# Patient Record
Sex: Female | Born: 1975 | Race: White | Hispanic: No | Marital: Married | Smoking: Never smoker
Health system: Southern US, Community
[De-identification: ages and names within clinical notes are randomized; demographics above are authoritative.]

## PROBLEM LIST (undated history)

## (undated) DIAGNOSIS — E079 Disorder of thyroid, unspecified: Secondary | ICD-10-CM

---

## 2001-02-05 ENCOUNTER — Other Ambulatory Visit: Admission: RE | Admit: 2001-02-05 | Discharge: 2001-02-05 | Payer: Self-pay | Admitting: Obstetrics and Gynecology

## 2001-03-19 ENCOUNTER — Encounter: Admission: RE | Admit: 2001-03-19 | Discharge: 2001-03-19 | Payer: Self-pay | Admitting: *Deleted

## 2001-03-19 ENCOUNTER — Encounter: Payer: Self-pay | Admitting: *Deleted

## 2001-07-03 ENCOUNTER — Ambulatory Visit (HOSPITAL_BASED_OUTPATIENT_CLINIC_OR_DEPARTMENT_OTHER): Admission: RE | Admit: 2001-07-03 | Discharge: 2001-07-03 | Payer: Self-pay | Admitting: *Deleted

## 2001-07-03 ENCOUNTER — Encounter (INDEPENDENT_AMBULATORY_CARE_PROVIDER_SITE_OTHER): Payer: Self-pay | Admitting: *Deleted

## 2002-03-19 ENCOUNTER — Other Ambulatory Visit: Admission: RE | Admit: 2002-03-19 | Discharge: 2002-03-19 | Payer: Self-pay | Admitting: *Deleted

## 2003-04-04 ENCOUNTER — Other Ambulatory Visit: Admission: RE | Admit: 2003-04-04 | Discharge: 2003-04-04 | Payer: Self-pay | Admitting: *Deleted

## 2003-09-18 ENCOUNTER — Inpatient Hospital Stay (HOSPITAL_COMMUNITY): Admission: AD | Admit: 2003-09-18 | Discharge: 2003-09-18 | Payer: Self-pay | Admitting: Obstetrics and Gynecology

## 2003-09-28 ENCOUNTER — Inpatient Hospital Stay (HOSPITAL_COMMUNITY): Admission: AD | Admit: 2003-09-28 | Discharge: 2003-09-28 | Payer: Self-pay | Admitting: Obstetrics and Gynecology

## 2003-10-16 ENCOUNTER — Inpatient Hospital Stay (HOSPITAL_COMMUNITY): Admission: AD | Admit: 2003-10-16 | Discharge: 2003-10-18 | Payer: Self-pay | Admitting: Obstetrics and Gynecology

## 2003-11-10 ENCOUNTER — Inpatient Hospital Stay (HOSPITAL_COMMUNITY): Admission: AD | Admit: 2003-11-10 | Discharge: 2003-11-10 | Payer: Self-pay | Admitting: Obstetrics and Gynecology

## 2003-11-24 ENCOUNTER — Other Ambulatory Visit: Admission: RE | Admit: 2003-11-24 | Discharge: 2003-11-24 | Payer: Self-pay | Admitting: Obstetrics and Gynecology

## 2005-02-17 ENCOUNTER — Other Ambulatory Visit: Admission: RE | Admit: 2005-02-17 | Discharge: 2005-02-17 | Payer: Self-pay | Admitting: Obstetrics and Gynecology

## 2005-09-01 ENCOUNTER — Inpatient Hospital Stay (HOSPITAL_COMMUNITY): Admission: AD | Admit: 2005-09-01 | Discharge: 2005-09-03 | Payer: Self-pay | Admitting: Obstetrics and Gynecology

## 2006-03-15 ENCOUNTER — Encounter (HOSPITAL_COMMUNITY): Admission: RE | Admit: 2006-03-15 | Discharge: 2006-03-16 | Payer: Self-pay | Admitting: Internal Medicine

## 2006-08-15 ENCOUNTER — Ambulatory Visit: Payer: Self-pay | Admitting: "Endocrinology

## 2006-11-16 ENCOUNTER — Ambulatory Visit: Payer: Self-pay | Admitting: "Endocrinology

## 2007-03-02 ENCOUNTER — Encounter: Admission: RE | Admit: 2007-03-02 | Discharge: 2007-03-02 | Payer: Self-pay | Admitting: Family Medicine

## 2009-02-03 IMAGING — CR DG CHEST 2V
2 series · 2 of 2 positions shown · non-contrast
Comparison: none

CLINICAL DATA: Cough and fever.  Query left lower lobar pneumonia.  
CHEST, TWO VIEWS: 
Mild likely chronic peribronchial thickening.  Suggestion of poor definition of portion of the posterior aspect of the left hemidiaphragm on the lateral view and moderate suspicion for abnormal density at the posteromedial base of the left lower lobe.  This is moderately suspicious for pneumonia.  Normal cardiac size and shape.

[w chest pa]
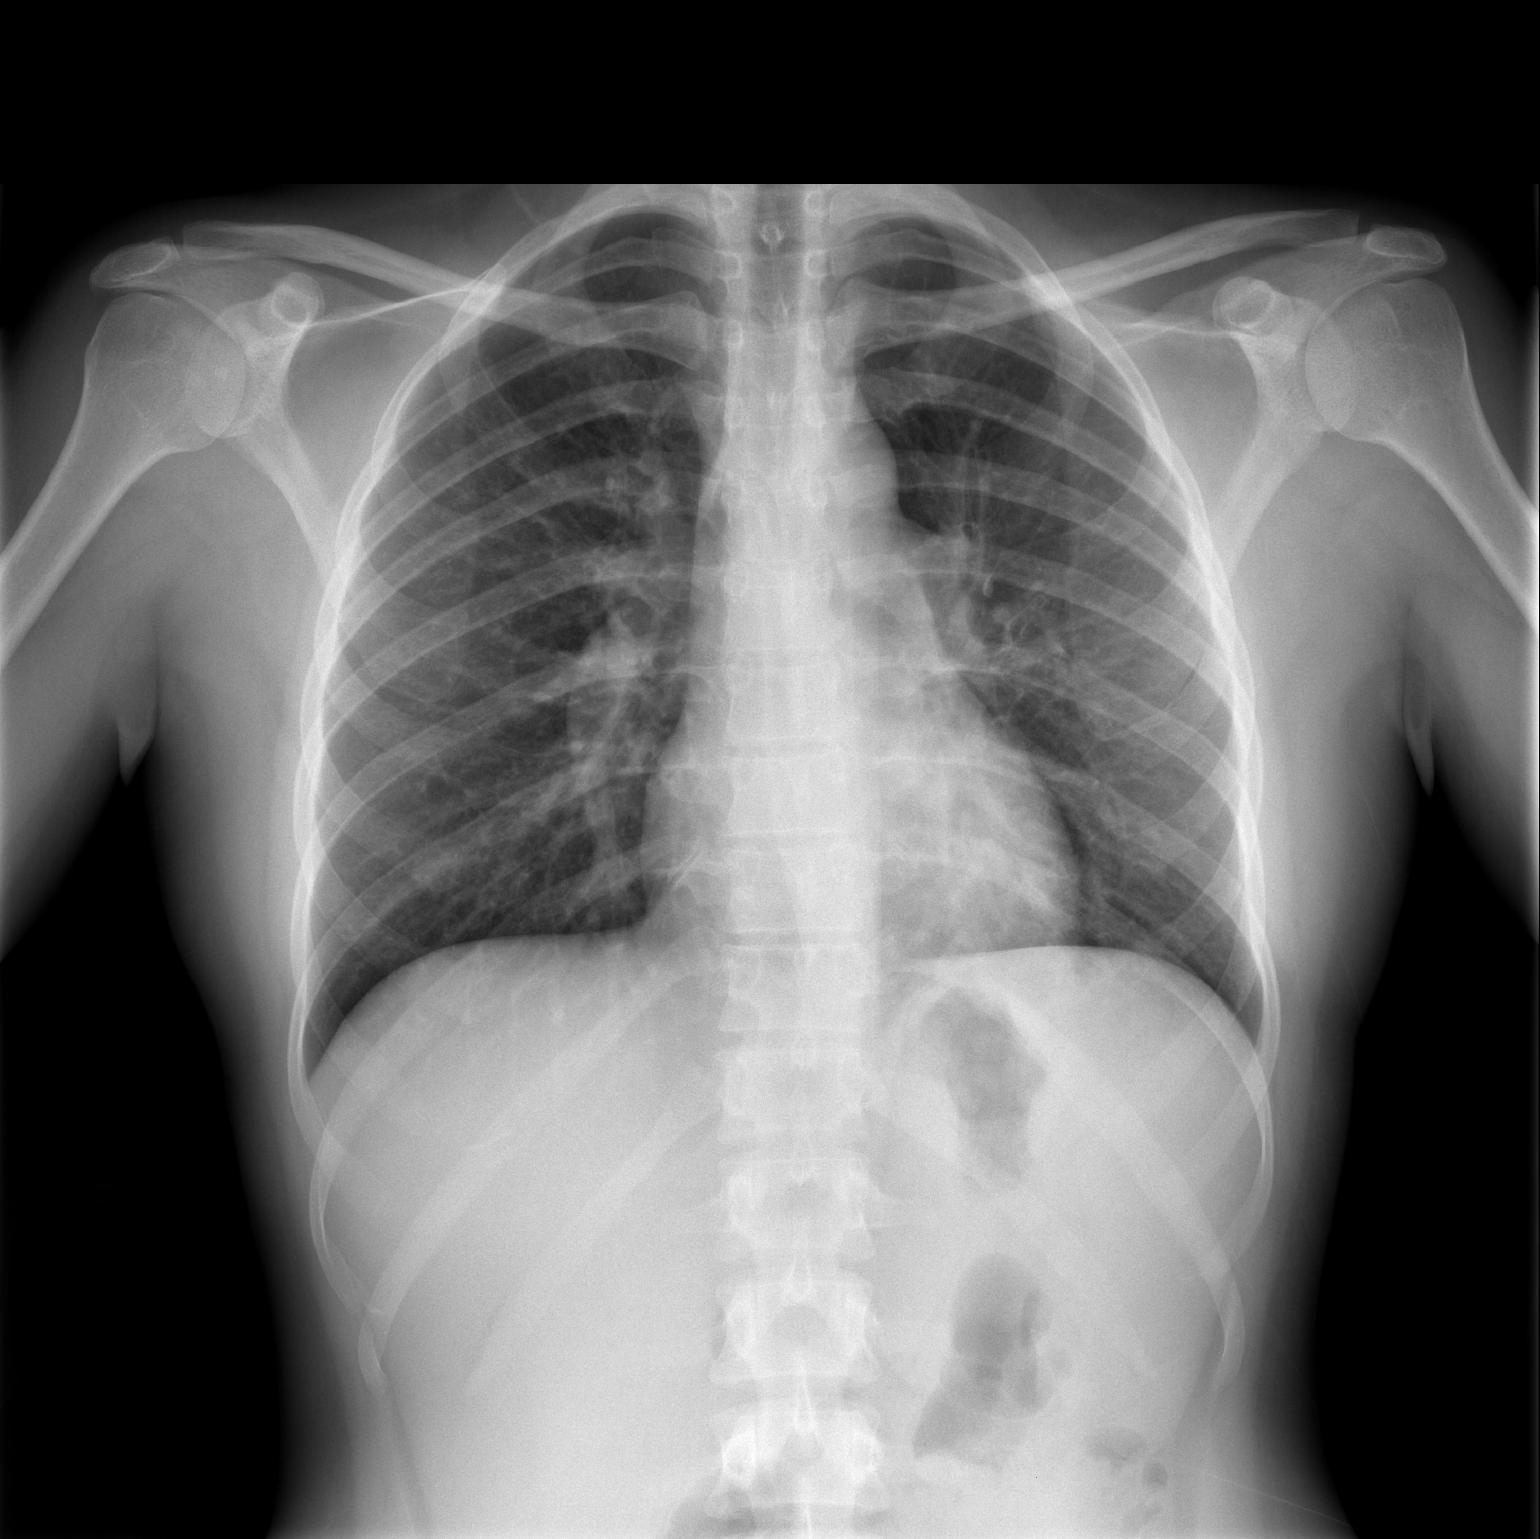

[w chest lat]
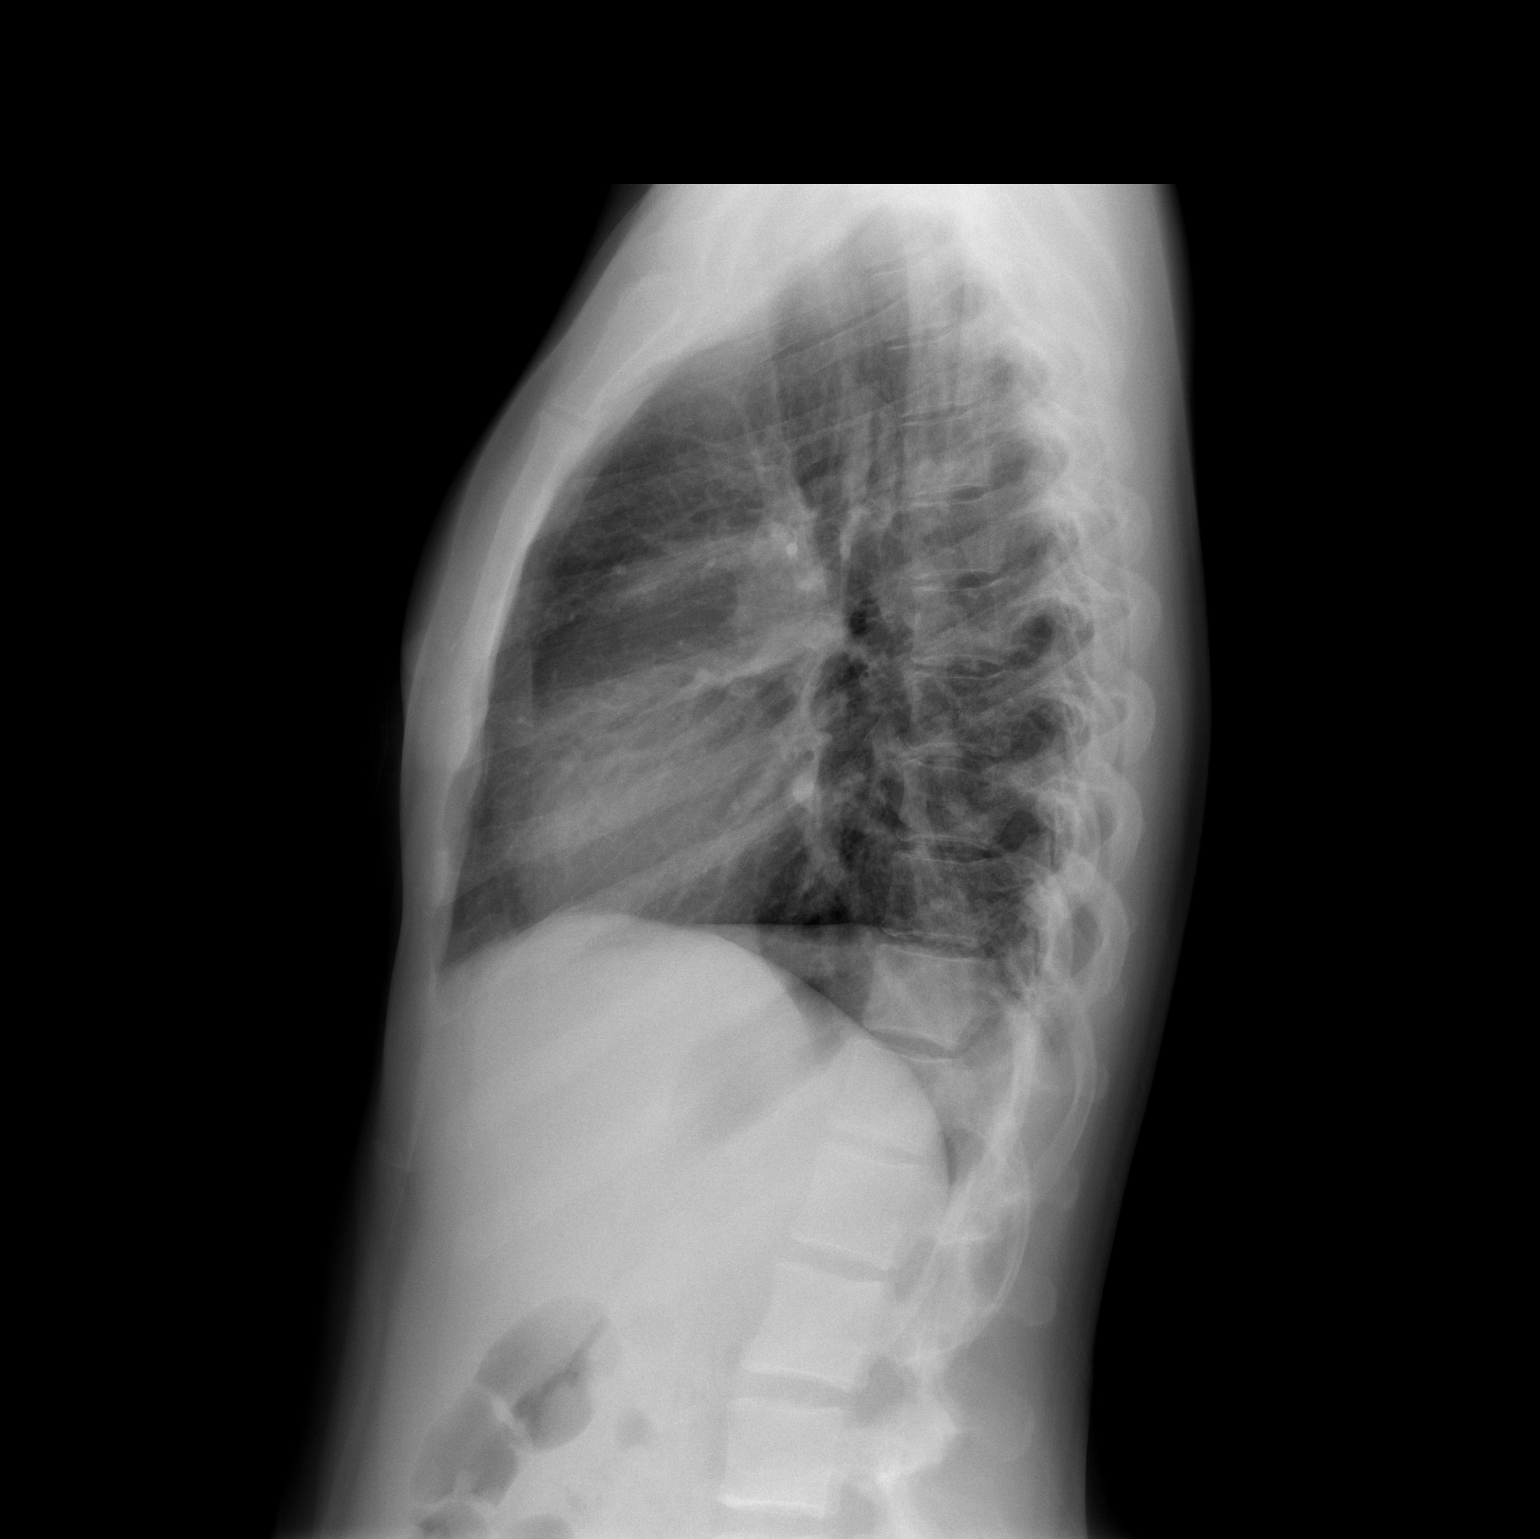

[2 of 2 positions shown; findings below may reference images not displayed]

IMPRESSION: Moderate suspicion for left lower lobar pneumonia.  Mild chronic appearing peribronchial changes.

## 2010-05-21 NOTE — Op Note (Signed)
[facility]  Patient:    Cynthia Gregory, Cynthia Gregory Visit Number: [id number] MRN: [id number]          Service Type: DSU Location: [facility] Attending Physician:  Kandis Mannan Dictated by:   Donnie Coffin Samuella Cota, M.D. Proc. Date: 07/03/01 Admit Date:  07/03/2001   CC:         Willey Blade, M.D.   Operative Report  CCS# [id number]  PREOPERATIVE DIAGNOSIS:  Mass, right breast.  POSTOPERATIVE DIAGNOSIS:  Mass, right breast.  PROCEDURE:  Excision of mass, right breast.  SURGEON:  Maisie Fus B. Samuella Cota, M.D.  ANESTHESIA:  1% Xylocaine local with anesthesia monitoring by anesthesiology and CRNA Daphine Deutscher.  DESCRIPTION OF PROCEDURE:  The patient was taken to the operating room and placed on the table in the supine position.  The right breast was prepped and draped as a sterile field.  The patient had a small mass just beneath the edge of the areola at the 9 oclock position.  A curved circumareolar incision was outlined with the skin marker.  1% Xylocaine local was used to infiltrate skin and underlying breast tissue.  The curved incision was made and dissection taken down to the mass which was slightly large than a BB.  A small 3-0 Vicryl suture was placed through the small for traction and then some of the breast tissue around the mass was excised with sharp dissection and removed.  Upon removal, the mass seemed to be in the middle of the specimen and once again felt just larger than a BB.  Bleeding was controlled with the cautery and the wound was then closed with interrupted 3-0 Vicryl sutures followed by a running 4-0 Vicryl subcuticular suture.  Benzoin and 1/2 inch Steri-Strips were used to reinforce the skin closure.  A pressure dressing using 4x4s, ABD, and 4-inch Hypafix was applied.  The specimen was sent fresh, but no frozen section was requested.  The patient seemed to tolerate the procedure well and was taken to the PACU in satisfactory  condition. Dictated by:   Donnie Coffin Samuella Cota, M.D. Attending Physician:  Kandis Mannan DD:  07/03/01 TD:  07/04/01 Job: [id number]

## 2013-04-22 ENCOUNTER — Emergency Department (HOSPITAL_COMMUNITY)
Admission: EM | Admit: 2013-04-22 | Discharge: 2013-04-22 | Disposition: A | Discharge status: Home / Self Care | Payer: BC Managed Care – PPO | Attending: Emergency Medicine | Admitting: Emergency Medicine

## 2013-04-22 ENCOUNTER — Encounter (HOSPITAL_COMMUNITY): Payer: Self-pay | Admitting: Emergency Medicine

## 2013-04-22 DIAGNOSIS — Z8639 Personal history of other endocrine, nutritional and metabolic disease: Secondary | ICD-10-CM | POA: Insufficient documentation

## 2013-04-22 DIAGNOSIS — M7989 Other specified soft tissue disorders: Secondary | ICD-10-CM | POA: Insufficient documentation

## 2013-04-22 DIAGNOSIS — R209 Unspecified disturbances of skin sensation: Secondary | ICD-10-CM | POA: Insufficient documentation

## 2013-04-22 DIAGNOSIS — S199XXA Unspecified injury of neck, initial encounter: Principal | ICD-10-CM

## 2013-04-22 DIAGNOSIS — M542 Cervicalgia: Secondary | ICD-10-CM

## 2013-04-22 DIAGNOSIS — Y9389 Activity, other specified: Secondary | ICD-10-CM | POA: Insufficient documentation

## 2013-04-22 DIAGNOSIS — Y92410 Unspecified street and highway as the place of occurrence of the external cause: Secondary | ICD-10-CM | POA: Insufficient documentation

## 2013-04-22 DIAGNOSIS — S0993XA Unspecified injury of face, initial encounter: Secondary | ICD-10-CM | POA: Insufficient documentation

## 2013-04-22 DIAGNOSIS — Z862 Personal history of diseases of the blood and blood-forming organs and certain disorders involving the immune mechanism: Secondary | ICD-10-CM | POA: Insufficient documentation

## 2013-04-22 HISTORY — DX: Disorder of thyroid, unspecified: E07.9

## 2013-04-22 MED ORDER — MELOXICAM 7.5 MG PO TABS: 15.0000 mg | ORAL_TABLET | Freq: Every day | ORAL | Status: AC

## 2013-04-22 MED ORDER — CYCLOBENZAPRINE HCL 10 MG PO TABS: 10.0000 mg | ORAL_TABLET | Freq: Two times a day (BID) | ORAL | Status: AC | PRN
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
## 2013-04-25 NOTE — ED Provider Notes (Signed)
Medical screening examination/treatment/procedure(s) were performed by non-physician practitioner and as supervising physician I was immediately available for consultation/collaboration.   EKG Interpretation None        Dagmar HaitWilliam Latise Dilley, MD 04/25/13 [phone]

## 2013-05-29 ENCOUNTER — Ambulatory Visit
Admission: RE | Admit: 2013-05-29 | Discharge: 2013-05-29 | Disposition: A | Discharge status: Home / Self Care | Payer: BC Managed Care – PPO | Source: Ambulatory Visit | Attending: Nurse Practitioner | Admitting: Nurse Practitioner

## 2013-05-29 ENCOUNTER — Other Ambulatory Visit: Payer: Self-pay | Admitting: Nurse Practitioner

## 2013-05-29 ENCOUNTER — Encounter (INDEPENDENT_AMBULATORY_CARE_PROVIDER_SITE_OTHER): Payer: Self-pay

## 2013-05-29 DIAGNOSIS — E042 Nontoxic multinodular goiter: Secondary | ICD-10-CM

## 2014-09-11 ENCOUNTER — Other Ambulatory Visit: Payer: Self-pay | Admitting: Nurse Practitioner

## 2014-09-11 DIAGNOSIS — E041 Nontoxic single thyroid nodule: Secondary | ICD-10-CM

## 2014-09-11 DIAGNOSIS — E039 Hypothyroidism, unspecified: Secondary | ICD-10-CM

## 2014-09-18 ENCOUNTER — Other Ambulatory Visit: Payer: Self-pay
